# Patient Record
Sex: Male | Born: 1970 | Race: White | Hispanic: No | Marital: Married | Smoking: Former smoker
Health system: Southern US, Community
[De-identification: ages and names within clinical notes are randomized; demographics above are authoritative.]

## PROBLEM LIST (undated history)

## (undated) DIAGNOSIS — E119 Type 2 diabetes mellitus without complications: Secondary | ICD-10-CM

## (undated) DIAGNOSIS — I4891 Unspecified atrial fibrillation: Secondary | ICD-10-CM

## (undated) DIAGNOSIS — I10 Essential (primary) hypertension: Secondary | ICD-10-CM

## (undated) DIAGNOSIS — J449 Chronic obstructive pulmonary disease, unspecified: Secondary | ICD-10-CM

---

## 2018-12-22 ENCOUNTER — Emergency Department: Payer: Self-pay

## 2018-12-22 ENCOUNTER — Other Ambulatory Visit: Payer: Self-pay

## 2018-12-22 ENCOUNTER — Encounter: Payer: Self-pay | Admitting: Emergency Medicine

## 2018-12-22 ENCOUNTER — Emergency Department
Admission: EM | Admit: 2018-12-22 | Discharge: 2018-12-22 | Disposition: A | Discharge status: Home / Self Care | Payer: Self-pay | Attending: Emergency Medicine | Admitting: Emergency Medicine

## 2018-12-22 DIAGNOSIS — R0600 Dyspnea, unspecified: Secondary | ICD-10-CM | POA: Insufficient documentation

## 2018-12-22 DIAGNOSIS — Z87891 Personal history of nicotine dependence: Secondary | ICD-10-CM | POA: Insufficient documentation

## 2018-12-22 DIAGNOSIS — J449 Chronic obstructive pulmonary disease, unspecified: Secondary | ICD-10-CM | POA: Insufficient documentation

## 2018-12-22 DIAGNOSIS — M79602 Pain in left arm: Secondary | ICD-10-CM | POA: Insufficient documentation

## 2018-12-22 DIAGNOSIS — I10 Essential (primary) hypertension: Secondary | ICD-10-CM | POA: Insufficient documentation

## 2018-12-22 DIAGNOSIS — R0789 Other chest pain: Secondary | ICD-10-CM | POA: Insufficient documentation

## 2018-12-22 DIAGNOSIS — E119 Type 2 diabetes mellitus without complications: Secondary | ICD-10-CM | POA: Insufficient documentation

## 2018-12-22 DIAGNOSIS — R42 Dizziness and giddiness: Secondary | ICD-10-CM | POA: Insufficient documentation

## 2018-12-22 HISTORY — DX: Unspecified atrial fibrillation: I48.91

## 2018-12-22 HISTORY — DX: Essential (primary) hypertension: I10

## 2018-12-22 HISTORY — DX: Chronic obstructive pulmonary disease, unspecified: J44.9

## 2018-12-22 HISTORY — DX: Type 2 diabetes mellitus without complications: E11.9

## 2018-12-22 LAB — BASIC METABOLIC PANEL
Anion gap: 12 (ref 5–15)
BUN: 18 mg/dL (ref 6–20)
CO2: 26 mmol/L (ref 22–32)
Calcium: 8.7 mg/dL — ABNORMAL LOW (ref 8.9–10.3)
Chloride: 102 mmol/L (ref 98–111)
Creatinine, Ser: 0.64 mg/dL (ref 0.61–1.24)
GFR calc Af Amer: >60 mL/min (ref >60)
GFR calc non Af Amer: >60 mL/min (ref >60)
Glucose, Bld: 117 mg/dL — ABNORMAL HIGH (ref 70–99)
Potassium: 3.8 mmol/L (ref 3.5–5.1)
Sodium: 140 mmol/L (ref 135–145)

## 2018-12-22 LAB — FIBRIN DERIVATIVES D-DIMER (ARMC ONLY): Fibrin derivatives D-dimer (ARMC): 286.07 ng/mL (FEU) (ref 0.00–499.00)

## 2018-12-22 LAB — TROPONIN I
Troponin I: <0.03 ng/mL (ref <0.03)
Troponin I: <0.03 ng/mL (ref <0.03)

## 2018-12-22 LAB — CBC
HCT: 39.3 % (ref 39.0–52.0)
Hemoglobin: 13 g/dL (ref 13.0–17.0)
MCH: 29.2 pg (ref 26.0–34.0)
MCHC: 33.1 g/dL (ref 30.0–36.0)
MCV: 88.3 fL (ref 80.0–100.0)
Platelets: 239 10*3/uL (ref 150–400)
RBC: 4.45 MIL/uL (ref 4.22–5.81)
RDW: 12.9 % (ref 11.5–15.5)
WBC: 10.6 10*3/uL — ABNORMAL HIGH (ref 4.0–10.5)
nRBC: 0 % (ref 0.0–0.2)

## 2018-12-22 MED ORDER — PREDNISONE 10 MG (21) PO TBPK: ORAL_TABLET | ORAL | 0 refills | Status: AC

## 2018-12-22 MED ORDER — IPRATROPIUM-ALBUTEROL 0.5-2.5 (3) MG/3ML IN SOLN
3.0000 mL | Freq: Once | RESPIRATORY_TRACT | Status: AC
  Administered 2018-12-22: 3 mL via RESPIRATORY_TRACT
  Filled 2018-12-22: qty 3

## 2018-12-22 MED ORDER — HYDROCOD POLST-CPM POLST ER 10-8 MG/5ML PO SUER: 5.0000 mL | Freq: Two times a day (BID) | ORAL | 0 refills | Status: AC

## 2018-12-22 NOTE — ED Notes (Signed)
Removed IV in left hand. Pt is ready to be discharged.

## 2018-12-22 NOTE — ED Triage Notes (Signed)
Pt states he woke up around 0100 with shob, chest tightness, pain down left arm and left shoulder blade. Hx of COPD with chronic cough, denies fever. Does have dizziness as well. Hx of afib. NAD.

## 2018-12-22 NOTE — ED Provider Notes (Signed)
[facility] Emergency Department Provider Note       Time seen: ----------------------------------------- 10:43 AM on 12/22/2018 -----------------------------------------   I have reviewed the triage vital signs and the nursing notes.  HISTORY   Chief Complaint Chest Pain and Shortness of Breath    HPI Devin FormosaJeffrey Wise is a 48 y.o. male with a history of atrial fibrillation, COPD, diabetes and hypertension who presents to the ED for shortness of breath, chest tightness and pain down the left arm and into the left shoulder blade.  Pain seemed to start about 1 AM last night.  He has a history of COPD with chronic cough but denies any fever.  Patient reports he did have some dizziness as well.  Pain is 3 out of 10, nothing makes it better or worse  Past Medical History:  Diagnosis Date  . A-fib (HCC)   . COPD (chronic obstructive pulmonary disease) (HCC)   . Diabetes mellitus without complication (HCC)   . Hypertension     There are no active problems to display for this patient.   History reviewed. No pertinent surgical history.  Allergies Penicillins  Social History Social History   Tobacco Use  . Smoking status: Former Games developermoker  . Smokeless tobacco: Never Used  . Tobacco comment: quit 3 weeks ago  Substance Use Topics  . Alcohol use: Not Currently  . Drug use: Not on file    Review of Systems Constitutional: Negative for fever. Cardiovascular: Positive for chest pain Respiratory: Positive for shortness of breath Gastrointestinal: Negative for abdominal pain, vomiting and diarrhea. Musculoskeletal: Positive for left arm pain Skin: Negative for rash. Neurological: Negative for headaches, focal weakness or numbness.  All systems negative/normal/unremarkable except as stated in the HPI  ____________________________________________   PHYSICAL EXAM:  VITAL SIGNS: ED Triage Vitals  Enc Vitals Group     BP 12/22/18 1041 (!) 160/111      Pulse Rate 12/22/18 1041 71     Resp 12/22/18 1041 20     Temp 12/22/18 1041 98.3 F (36.8 C)     Temp Source 12/22/18 1041 Oral     SpO2 12/22/18 1041 98 %     Weight 12/22/18 1038 (!) 335 lb (152 kg)     Height 12/22/18 1038 6\' 2"  (1.88 m)     Head Circumference --      Peak Flow --      Pain Score 12/22/18 1038 3     Pain Loc --      Pain Edu? --      Excl. in GC? --    Constitutional: Alert and oriented. Well appearing and in no distress. Eyes: Conjunctivae are normal. Normal extraocular movements. ENT      Head: Normocephalic and atraumatic.      Nose: No congestion/rhinnorhea.      Mouth/Throat: Mucous membranes are moist.      Neck: No stridor. Cardiovascular: Normal rate, regular rhythm. No murmurs, rubs, or gallops. Respiratory: Normal respiratory effort without tachypnea nor retractions. Breath sounds are clear and equal bilaterally. No wheezes/rales/rhonchi. Gastrointestinal: Soft and nontender. Normal bowel sounds Musculoskeletal: Nontender with normal range of motion in extremities. No lower extremity tenderness nor edema. Neurologic:  Normal speech and language. No gross focal neurologic deficits are appreciated.  Skin:  Skin is warm, dry and intact. No rash noted. Psychiatric: Mood and affect are normal. Speech and behavior are normal.  ____________________________________________  EKG: Interpreted by me.  Sinus rhythm with rate of 75 bpm, normal PR  interval, normal QRS, normal QT  Viewed EKG, normal sinus rhythm with a rate of 74 bpm, normal PR interval, wide QRS, normal QT  ____________________________________________  ED COURSE:  As part of my medical decision making, I reviewed the following data within the electronic MEDICAL RECORD NUMBER History obtained from family if available, nursing notes, old chart and ekg, as well as notes from prior ED visits. Patient presented for chest pain and shortness of breath, we will assess with labs and imaging as indicated at  this time.   Procedures  Devin Wise was evaluated in Emergency Department on 12/22/2018 for the symptoms described in the history of present illness. He was evaluated in the context of the global COVID-19 pandemic, which necessitated consideration that the patient might be at risk for infection with the SARS-CoV-2 virus that causes COVID-19. Institutional protocols and algorithms that pertain to the evaluation of patients at risk for COVID-19 are in a state of rapid change based on information released by regulatory bodies including the CDC and federal and state organizations. These policies and algorithms were followed during the patient's care in the ED.  ____________________________________________   LABS (pertinent positives/negatives)  Labs Reviewed  BASIC METABOLIC PANEL - Abnormal; Notable for the following components:      Result Value   Glucose, Bld 117 (*)    Calcium 8.7 (*)    All other components within normal limits  CBC - Abnormal; Notable for the following components:   WBC 10.6 (*)    All other components within normal limits  TROPONIN I  FIBRIN DERIVATIVES D-DIMER ([facility] ONLY)  TROPONIN I    RADIOLOGY Images were viewed by me  Chest x-ray IMPRESSION: Coarse perihilar and bibasilar bronchovascular markings of uncertain chronicity.  ____________________________________________   DIFFERENTIAL DIAGNOSIS   Musculoskeletal pain, GERD, PE, dissection, anxiety, unstable angina  FINAL ASSESSMENT AND PLAN  Chest pain   Plan: The patient had presented for chest pain and difficulty breathing of uncertain etiology, possible COPD. Patient's labs were reassuring including repeat troponin. Patient's imaging was negative.  He was given a DuoNeb here, I will discharge with steroids and cough medicine.  I have advised taking a baby aspirin daily due to him having some chest pain.  He is cleared for outpatient follow-up and states he feels better.   Devin Dash,  MD    Note: This note was generated in part or whole with voice recognition software. Voice recognition is usually quite accurate but there are transcription errors that can and very often do occur. I apologize for any typographical errors that were not detected and corrected.     Emily Filbert, MD 12/22/18 [phone]

## 2018-12-22 NOTE — ED Notes (Signed)
This nurse attempted IV x2 

## 2018-12-22 NOTE — ED Notes (Signed)
Ali Lowe RN attempted IV x2

## 2020-07-20 IMAGING — DX PORTABLE CHEST - 1 VIEW
1 series · 1 of 1 positions shown · non-contrast
Comparison: none

CLINICAL DATA: Patient complains of sob, chest tightness,
dizziness, pain down left arm and left shoulder blade x1 day. Hx of
COPD with chronic cough, ex smoker, htn, dm, cpod, afib

EXAM:
PORTABLE CHEST - 1 VIEW

[chest ap]
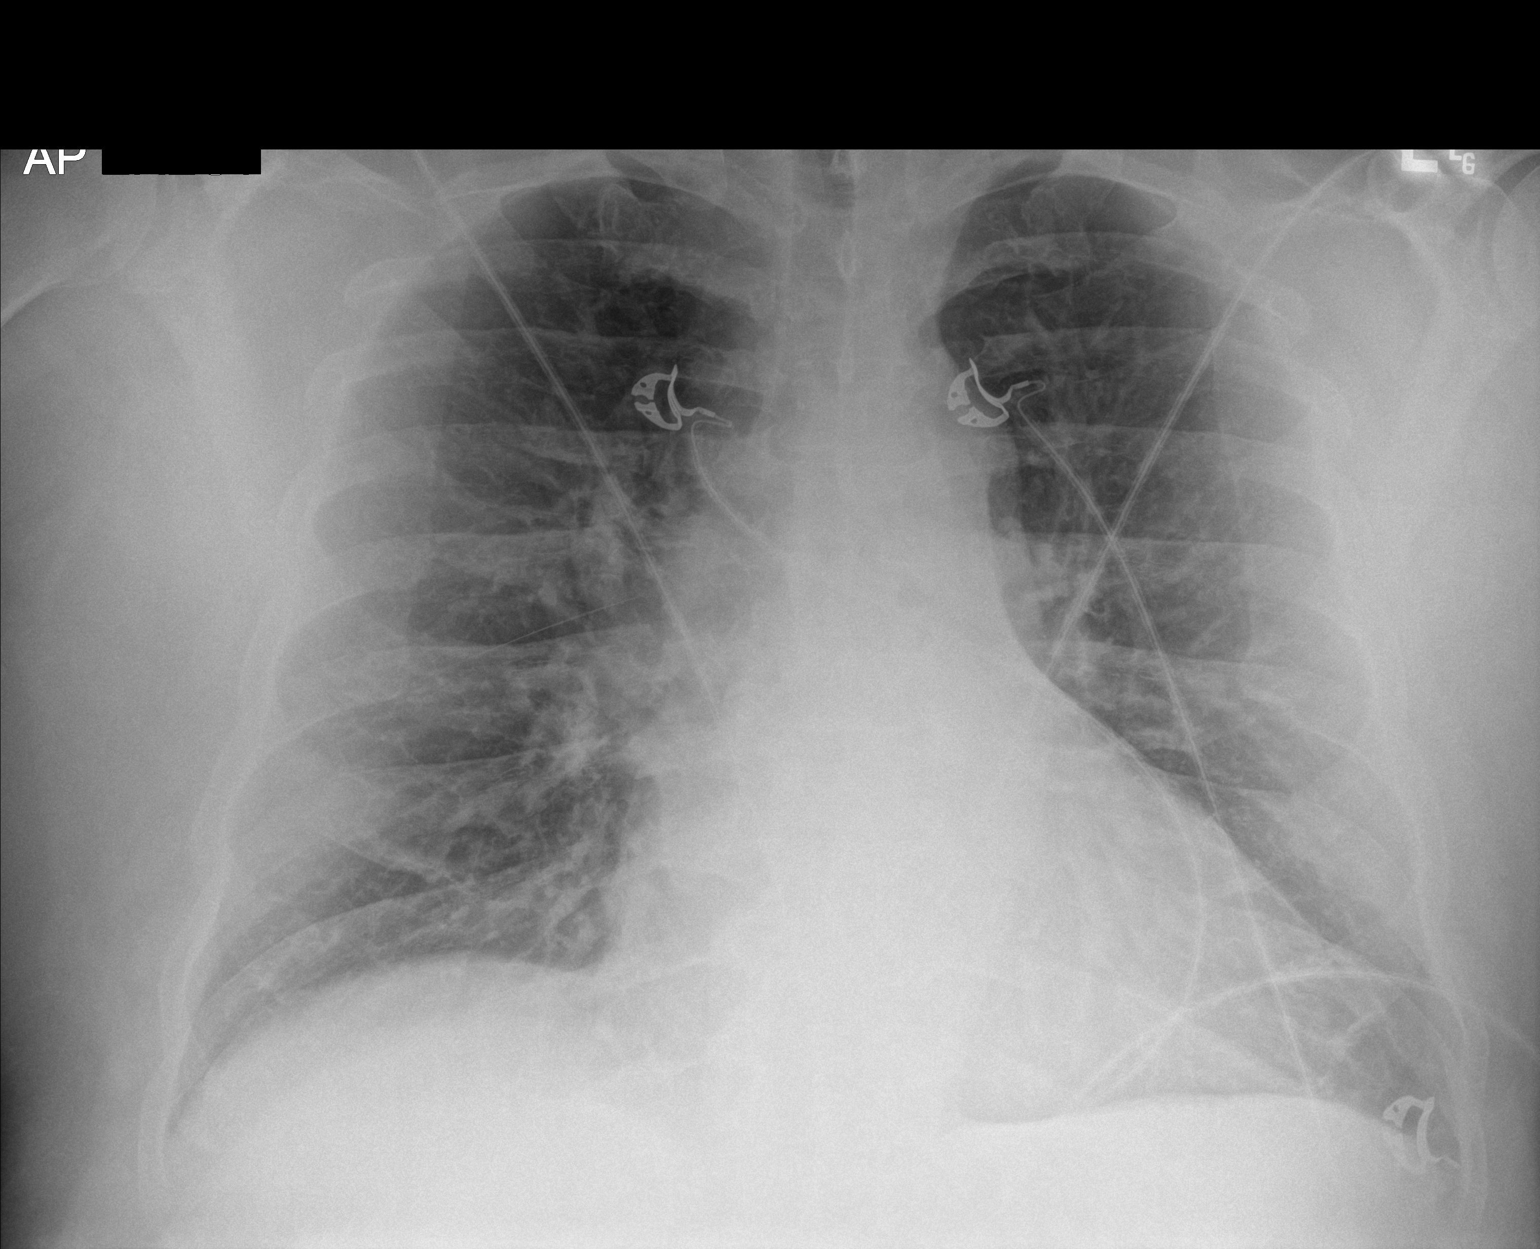

[1 of 1 positions shown; findings below may reference images not displayed]

FINDINGS: Coarse perihilar and bibasilar bronchovascular markings of uncertain
chronicity. No confluent airspace disease or overt edema.

Heart size upper limits normal for technique.

No effusion. No pneumothorax.

Visualized bones unremarkable.
IMPRESSION: Coarse perihilar and bibasilar bronchovascular markings of uncertain
chronicity.
